# Patient Record
Sex: Male | Born: 1980 | Hispanic: Yes | Marital: Married | Smoking: Never smoker
Health system: Southern US, Community
[De-identification: ages and names within clinical notes are randomized; demographics above are authoritative.]

## PROBLEM LIST (undated history)

## (undated) HISTORY — PX: KNEE SURGERY: SHX244

---

## 2010-03-10 ENCOUNTER — Ambulatory Visit: Payer: Self-pay | Admitting: Diagnostic Radiology

## 2010-03-10 ENCOUNTER — Emergency Department (HOSPITAL_BASED_OUTPATIENT_CLINIC_OR_DEPARTMENT_OTHER): Admission: EM | Admit: 2010-03-10 | Discharge: 2010-03-10 | Payer: Self-pay | Admitting: Emergency Medicine

## 2010-03-30 ENCOUNTER — Encounter: Admission: RE | Admit: 2010-03-30 | Discharge: 2010-04-23 | Payer: Self-pay | Admitting: *Deleted

## 2011-09-03 ENCOUNTER — Ambulatory Visit: Payer: BC Managed Care – PPO

## 2011-12-29 ENCOUNTER — Ambulatory Visit: Payer: BC Managed Care – PPO

## 2012-01-17 ENCOUNTER — Telehealth: Payer: Self-pay

## 2012-01-17 MED ORDER — VALACYCLOVIR HCL 1 G PO TABS: 1000.0000 mg | ORAL_TABLET | Freq: Two times a day (BID) | ORAL | Status: AC

## 2012-01-17 NOTE — Telephone Encounter (Signed)
Please advise the patient that he needs an office visit in June.

## 2012-01-17 NOTE — Telephone Encounter (Signed)
Please pull paper chart.  

## 2012-01-17 NOTE — Telephone Encounter (Signed)
Pt seen for cold sores in the past,told to call if he wanted prescription for same,   Best phone [phone]   Pharmacy  [facility] [address]

## 2012-01-18 NOTE — Telephone Encounter (Signed)
NOTIFIED WIFE THAT PT NEEDS OFFICE VISIT IN June AND THAT RX WAS SENT IN

## 2013-09-12 ENCOUNTER — Ambulatory Visit (INDEPENDENT_AMBULATORY_CARE_PROVIDER_SITE_OTHER): Payer: BC Managed Care – PPO | Admitting: Family Medicine

## 2013-09-12 VITALS — BP 130/80 | HR 88 | Temp 99.0°F | Resp 16 | Ht 68.0 in | Wt 175.0 lb

## 2013-09-12 DIAGNOSIS — T7840XA Allergy, unspecified, initial encounter: Secondary | ICD-10-CM

## 2013-09-12 MED ORDER — PREDNISONE 20 MG PO TABS: 40.0000 mg | ORAL_TABLET | Freq: Every day | ORAL | Status: DC

## 2013-09-12 NOTE — Progress Notes (Signed)
° °  Subjective:  This chart was scribed for Elvina SidleKurt Lauenstein, MD by Carl Bestelina Holson, Medical Scribe. This patient was seen in Room 5 and the patient's care was started at 7:51 PM.   Patient ID: Jack Gordon, male    DOB: 10-30-1980, 33 y.o.   MRN: [id number]  HPI HPI Comments: Jack Houstonorfirio Gordon is a 33 y.o. male who presents to the [facility] complaining of irritation on his penis.  He states that he came to [facility] couple of years ago with similar symptoms and was told that he was diagnosed with a latex allergy.  He denies rash an as associated symptom.    History reviewed. No pertinent past medical history. Past Surgical History  Procedure Laterality Date   Knee surgery Right    History reviewed. No pertinent family history. History   Social History   Marital Status: Married    Spouse Name: N/A    Number of Children: N/A   Years of Education: N/A   Occupational History   Not on file.   Social History Main Topics   Smoking status: Never Smoker    Smokeless tobacco: Not on file   Alcohol Use: Not on file   Drug Use: Not on file   Sexual Activity: Not on file   Other Topics Concern   Not on file   Social History Narrative   No narrative on file   Allergies  Allergen Reactions   Latex Rash    Review of Systems  Skin: Negative for rash.  All other systems reviewed and are negative.     Objective:  Physical Exam  Nursing note and vitals reviewed. Constitutional: He is oriented to person, place, and time. He appears well-developed and well-nourished.  HENT:  Head: Normocephalic and atraumatic.  Eyes: EOM are normal. Pupils are equal, round, and reactive to light.  Neck: Normal range of motion and phonation normal.  Cardiovascular: Normal rate.   Pulmonary/Chest: Effort normal. He exhibits no bony tenderness.  Abdominal: Soft. Normal appearance.  Musculoskeletal: Normal range of motion.  Neurological: He is alert and oriented to  person, place, and time. No cranial nerve deficit or sensory deficit. He exhibits normal muscle tone. Coordination normal.  Skin: Skin is warm, dry and intact.  Psychiatric: He has a normal mood and affect. His behavior is normal. Judgment and thought content normal.   Penis shows some hyperemia at corona, uncircumcised with few flesh colored papuls  BP 130/80   Pulse 88   Temp(Src) 99 F (37.2 C) (Oral)   Resp 16   Ht 5\' 8"  (1.727 m)   Wt 175 lb (79.379 kg)   BMI 26.61 kg/m2   SpO2 100%  Assessment & Plan:   Meds ordered this encounter  Medications   Multiple Vitamins-Minerals (MULTIVITAL PO)    Sig: Take by mouth.   predniSONE (DELTASONE) 20 MG tablet    Sig: Take 2 tablets (40 mg total) by mouth daily.    Dispense:  10 tablet    Refill:  0   1. Allergic reaction, initial encounter     I personally performed the services described in this documentation, which was scribed in my presence. The recorded information has been reviewed and is accurate.  Kenyon AnaKurt Lauenstein,MD

## 2013-09-24 ENCOUNTER — Telehealth: Payer: Self-pay

## 2013-09-24 NOTE — Telephone Encounter (Signed)
Patient states the he was seen for an allergic reaction and was given Prednisone 20mg . States that it is not working for him. States that he was seen here 2-3 years ago for the same problem and he was given a cream which worked well.   [phone]

## 2013-09-25 NOTE — Telephone Encounter (Signed)
Please refill miconazole for me

## 2013-09-25 NOTE — Telephone Encounter (Signed)
Pulled paper chart and he was given miconazole powder apply to affected area bid x 4 wks 03/01/11.

## 2013-09-26 MED ORDER — MICONAZOLE NITRATE 2 % EX CREA: 1.0000 "application " | TOPICAL_CREAM | Freq: Two times a day (BID) | CUTANEOUS | Status: DC

## 2013-09-26 NOTE — Telephone Encounter (Signed)
Sent in Rx. Pt aware. 

## 2013-10-13 ENCOUNTER — Telehealth: Payer: Self-pay

## 2013-10-13 ENCOUNTER — Ambulatory Visit (INDEPENDENT_AMBULATORY_CARE_PROVIDER_SITE_OTHER): Payer: BC Managed Care – PPO | Admitting: Family Medicine

## 2013-10-13 VITALS — BP 120/74 | HR 86 | Temp 98.0°F | Resp 15 | Ht 67.0 in | Wt 175.0 lb

## 2013-10-13 DIAGNOSIS — L309 Dermatitis, unspecified: Secondary | ICD-10-CM

## 2013-10-13 DIAGNOSIS — L259 Unspecified contact dermatitis, unspecified cause: Secondary | ICD-10-CM

## 2013-10-13 DIAGNOSIS — N4829 Other inflammatory disorders of penis: Secondary | ICD-10-CM

## 2013-10-13 LAB — GLUCOSE, POCT (MANUAL RESULT ENTRY): POC Glucose: 100 mg/dl — AB (ref 70–99)

## 2013-10-13 MED ORDER — FLUCONAZOLE 100 MG PO TABS: 100.0000 mg | ORAL_TABLET | Freq: Every day | ORAL | Status: DC

## 2013-10-13 NOTE — Telephone Encounter (Signed)
Call patient:  I wanted to wait for 2 or 3 days to see what his culture does show before giving him Valtrex or other medication.

## 2013-10-13 NOTE — Progress Notes (Signed)
  He used to be cream that Dr. Elbert EwingsL. gave him and he thinks that it after using it for a cough began making his penis burn. He denies using condoms anymore. He started getting a feeling of the skin on the glans of the penis no history of herpes. He says he was tested for in the past.  Objective: He has a area of excoriation of the skin on the dorsum of the penis about 1 cm in diameter no other blistering lesions are noted.  Assessment: Penile inflammation and pain and dermatitis  Plan: HSV titers. Check a glucose to make sure he is not diabetic. He is arty been treated with antifungal so it should not be yeast.  Decided to go ahead and give him a course of Diflucan. Wait for the HSV culture before giving any Valtrex since the symptoms were a little atypical by duration.   Results for orders placed in visit on 10/13/13  GLUCOSE, POCT (MANUAL RESULT ENTRY)      Result Value Range   POC Glucose 100 (*) 70 - 99 mg/dl

## 2013-10-13 NOTE — Patient Instructions (Addendum)
Take the Diflucan one pill daily for 5 days  Purchased some over-the-counter 1% hydrocortisone cream and use a small amount of it twice daily around the glans of the penis  I will try and let you know the results of your blood look in the next 2 or 3 days. If you do not hear from it by Tuesday call back

## 2013-10-13 NOTE — Telephone Encounter (Signed)
Pt would like a rx for valtrex, he states that he discussed getting this rx with Dr.Hopper during his OV today but it was not sent in, pt states that he has taken valtrex in the past.  [facility]

## 2013-10-14 NOTE — Telephone Encounter (Signed)
Left message to return call 

## 2013-10-14 NOTE — Telephone Encounter (Signed)
Patient notified and voiced understanding.

## 2013-10-15 LAB — HSV(HERPES SIMPLEX VRS) I + II AB-IGG
HSV 1 Glycoprotein G Ab, IgG: 12.57 IV — ABNORMAL HIGH
HSV 2 Glycoprotein G Ab, IgG: 0.88 IV

## 2013-10-19 ENCOUNTER — Telehealth: Payer: Self-pay | Admitting: *Deleted

## 2013-10-19 NOTE — Telephone Encounter (Signed)
Pt called about lab results.  Can you review them?  Pt can be called at [phone]

## 2013-10-20 NOTE — Telephone Encounter (Signed)
Pt aware of results He is planning to [facility]

## 2013-10-20 NOTE — Telephone Encounter (Signed)
Call Patient:  Labs show  no evidence of other herpes infection on his penis, however he has had the fever blister virus before but that did not cause this place on the penis.  If he continues to have symptoms he needs tor return for recheck.

## 2013-10-22 ENCOUNTER — Ambulatory Visit (INDEPENDENT_AMBULATORY_CARE_PROVIDER_SITE_OTHER): Payer: BC Managed Care – PPO | Admitting: Emergency Medicine

## 2013-10-22 VITALS — BP 138/92 | HR 108 | Temp 98.3°F | Resp 18 | Ht 66.5 in | Wt 179.2 lb

## 2013-10-22 DIAGNOSIS — N476 Balanoposthitis: Secondary | ICD-10-CM

## 2013-10-22 DIAGNOSIS — N481 Balanitis: Secondary | ICD-10-CM

## 2013-10-22 MED ORDER — NYSTATIN-TRIAMCINOLONE 100000-0.1 UNIT/GM-% EX OINT: 1.0000 "application " | TOPICAL_OINTMENT | Freq: Two times a day (BID) | CUTANEOUS | Status: DC

## 2013-10-22 NOTE — Progress Notes (Signed)
[facility] [address], [place] [address] [phone]  Date:  10/22/2013   Name:  Jack Gordon   DOB:  May 22, 1981   MRN:  [id number]  PCP:  No primary provider on file.    Chief Complaint: Allergic Reaction   History of Present Illness:  Jack Gordon is a 33 y.o. very pleasant male patient who presents with the following:  Seen a week ago for an eruption on the penis.  No improvement with medication.  Has returned.  No discharge or dysuria.  Herpes negative.  There are no active problems to display for this patient.   History reviewed. No pertinent past medical history.  Past Surgical History  Procedure Laterality Date  . Knee surgery Right     History  Substance Use Topics  . Smoking status: Never Smoker   . Smokeless tobacco: Not on file  . Alcohol Use: Not on file    History reviewed. No pertinent family history.  Allergies  Allergen Reactions  . Latex Rash    Medication list has been reviewed and updated.  Current Outpatient Prescriptions on File Prior to Visit  Medication Sig Dispense Refill  . fluconazole (DIFLUCAN) 100 MG tablet Take 1 tablet (100 mg total) by mouth daily.  5 tablet  0  . miconazole (MICOTIN) 2 % cream Apply 1 application topically 2 (two) times daily. Apply BID for 4 weeks  28.35 g  2  . Multiple Vitamins-Minerals (MULTIVITAL PO) Take by mouth.      . predniSONE (DELTASONE) 20 MG tablet Take 2 tablets (40 mg total) by mouth daily.  10 tablet  0  . VITAMIN C, CALCIUM ASCORBATE, PO Take by mouth.       No current facility-administered medications on file prior to visit.    Review of Systems:  As per HPI, otherwise negative.    Physical Examination: Filed Vitals:   10/22/13 1554  BP: 138/92  Pulse: 108  Temp: 98.3 F (36.8 C)  Resp: 18   Filed Vitals:   10/22/13 1554  Height: 5' 6.5" (1.689 m)  Weight: 179 lb 3.2 oz (81.285 kg)   Body mass index is 28.49 kg/(m^2). Ideal Body Weight:  Weight in (lb) to have BMI = 25: 156.9   GEN: WDWN, NAD, Non-toxic, Alert & Oriented x 3 HEENT: Atraumatic, Normocephalic.  Ears and Nose: No external deformity. EXTR: No clubbing/cyanosis/edema NEURO: Normal gait.  PSYCH: Normally interactive. Conversant. Not depressed or anxious appearing.  Calm demeanor.  GENITALIA  Uncircumcised male.  Very small papules on the glans.  Suggestive of fungal outbreak.  Assessment and Plan: Candidal balanitis mycolog Urology consult wants to discuss circumcision   Signed,  Phillips OdorJeffery Zackary Mckeone, MD

## 2013-10-22 NOTE — Patient Instructions (Signed)
Balanitis °(Balanitis) °La balanitis es la inflamación de la cabeza del pene (glande).  °CAUSAS  °Puede tener múltiples causas, tanto infecciosas como no infecciosas. Cuando se produce con frecuencia, es el resultado de una higiene personal deficiente, especialmente en los hombres no circuncidados. Sin una higiene adecuada, los virus, las bacterias y los hongos se acumulan entre el prepucio y el glande. Esto puede ocasionarle una infección. La falta de aire y la irritación debido a la secreción normal llamada esmegma contribuyen al problema en los hombres no circuncidados. Otras causas son: °· Irritación química por el uso de ciertos jabones y geles de ducha (especialmente jabones con perfume) condones, lubricantes personales, vaselina, espermicidas y acondicionadores de telas. °· Enfermedades de la piel, como el eczema, la dermatitis y la psoriasis. °· Alergias a algunos fármacos, como tetraciclina y sulfas. °· Ciertas enfermedades, como la cirrosis hepática, insuficiencia cardíaca congestiva y enfermedades renales. °· Obesidad mórbida. °FACTORES DE RIESGO °· Diabetes mellitus. °· Fimosis Un prepucio apretado que dificulta retirarlo hacia atrás hasta pasar el glande. °· Tener relaciones sexuales sin usar un condón. °SIGNOS Y SÍNTOMAS  °Los síntomas pueden ser: °· Secreción que proviene de la zona debajo del prepucio. °· Sensibilidad. °· Picazón e imposibilidad para mantener una erección (debido al dolor). °· Irritación y erupción. °· Llagas en el glande y en el prepucio. °DIAGNÓSTICO °El diagnóstico de balanitis se realiza a través de un examen físico. °TRATAMIENTO °El tratamiento se basa en la causa. El tratamiento incluye lavados frecuentes, mantener el glande y el prepucio secos, el uso de medicamentos como cremas, analgésicos, antibióticos o medicamentos para tratar infecciones por hongos. Puede usar baños de asiento. Si la irritación está originada en una cicatriz del prepucio que impide una retracción fácil,  se recomienda una circuncisión.  °INSTRUCCIONES PARA EL CUIDADO EN EL HOGAR °· Debe evitar las relaciones sexuales hasta que la afección se haya mejorado. °Document Released: 04/25/2013 °ExitCare® Patient Information ©2014 [employer]. ° ° °

## 2013-11-08 ENCOUNTER — Ambulatory Visit (INDEPENDENT_AMBULATORY_CARE_PROVIDER_SITE_OTHER): Payer: BC Managed Care – PPO | Admitting: Family Medicine

## 2013-11-08 VITALS — BP 146/86 | HR 91 | Temp 98.9°F | Resp 18 | Ht 67.0 in | Wt 175.0 lb

## 2013-11-08 DIAGNOSIS — N4889 Other specified disorders of penis: Secondary | ICD-10-CM

## 2013-11-08 NOTE — Progress Notes (Signed)
Subjective:   This chart was scribed for Jack Staggers, MD by Arlan Organ, [facility] Scribe. This patient was seen in room 12 and the patient's care was started 4:09 PM.     Patient ID: Jack Gordon, male    DOB: 1980/09/15, 33 y.o.   MRN: [id number]  HPI  HPI Comments: Jack Gordon is a 33 y.o. male who presents to [facility] seeking follow up today from initial visit on 09/12/13. Pt was treated with prednisone 40 mg for 5 days for an allergic reaction. Per telephone note,  Miconazole cream prescribed on 09/26/13 as apparent similar symptoms in the past, and was treated with this medication. Repeat evaluation 10/13/13. Excoriated area on dorsum of penis without blistering. Glucose normal. 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 Treated with number 5 Dyphloecane 100 mg daily. Recheck office visit of 2/16. Suspected candidal balanitis. Prescribed Mycolog and referred to urology to discuss circumcision. Similar penile symptoms in past including 2012 with a bump under the foreskin of the penis. Did have HSVIGG test performed. Positive for Type 1 only. Treated with Valtrex 100 mg BID times 7 days.  Pt now suspects a latex allergy from condoms he has recently been using. He has not noted any improvement since last visit, and states creams prescribed have been ineffective for him. At this time he denies any fever or penile discharge. Denies any new sexual contact.  Pt states he has cancelled his appointment with Urology due to his work schedule. He plans to call back as soon as possible to reschedule.  There are no active problems to display for this patient.  History reviewed. No pertinent past medical history. Past Surgical History  Procedure Laterality Date  . Knee surgery Right    Allergies  Allergen Reactions  . Latex Rash   Prior to Admission medications   Medication Sig Start Date End Date Taking?  Authorizing Provider  Multiple Vitamins-Minerals (MULTIVITAL PO) Take by mouth.   Yes Historical Provider, MD  VITAMIN C, CALCIUM ASCORBATE, PO Take by mouth.   Yes Historical Provider, MD  fluconazole (DIFLUCAN) 100 MG tablet Take 1 tablet (100 mg total) by mouth daily. 10/13/13   Peyton Najjar, MD  miconazole (MICOTIN) 2 % cream Apply 1 application topically 2 (two) times daily. Apply BID for 4 weeks 09/26/13   Elvina Sidle, MD  nystatin-triamcinolone ointment [facility]) Apply 1 application topically 2 (two) times daily. 10/22/13   Phillips Odor, MD  predniSONE (DELTASONE) 20 MG tablet Take 2 tablets (40 mg total) by mouth daily. 09/12/13   Elvina Sidle, MD   History   Social History  . Marital Status: Married    Spouse Name: N/A    Number of Children: N/A  . Years of Education: N/A   Occupational History  . Not on file.   Social History Main Topics  . Smoking status: Never Smoker   . Smokeless tobacco: Not on file  . Alcohol Use: Not on file  . Drug Use: Not on file  . Sexual Activity: Not on file   Other Topics Concern  . Not on file   Social History Narrative  . No narrative on file     Review of Systems  Constitutional: Negative for fever and chills.  HENT: Negative for congestion.   Eyes: Negative for redness.  Respiratory: Negative for cough.   Gastrointestinal: Negative for abdominal pain.  Genitourinary: Negative for dysuria,  discharge, penile swelling, scrotal swelling, difficulty urinating and penile pain.  Skin: Positive for rash.  Neurological: Negative for headaches.  Psychiatric/Behavioral: Negative for confusion.    Filed Vitals:   11/08/13 1520  BP: 146/86  Pulse: 91  Temp: 98.9 F (37.2 C)  TempSrc: Oral  Resp: 18  Height: 5\' 7"  (1.702 m)  Weight: 175 lb (79.379 kg)  SpO2: 97%    Objective:  Physical Exam  Nursing note and vitals reviewed. Constitutional: He is oriented to person, place, and time. He appears well-developed and  well-nourished.  HENT:  Head: Normocephalic and atraumatic.  Eyes: EOM are normal.  Neck: Normal range of motion.  Cardiovascular: Normal rate.   Pulmonary/Chest: Effort normal.  Genitourinary:  Minimal erythema of the corona No discharge No ulcers Small fine pearly papules around corona   Musculoskeletal: Normal range of motion.  Neurological: He is alert and oriented to person, place, and time.  Skin: Skin is warm and dry.  Psychiatric: He has a normal mood and affect. His behavior is normal.        Assessment & Plan:   Jack Gordon is a 33 y.o. male Pearly penile papules Discussed likely normal variant and no specific treatment for these. However if erythema of corona or surrounding skin, can use topical azole until urology eval. rtc precautions.   No orders of the defined types were placed in this encounter.   Patient Instructions  The small bumps appear to be "benign pearly papules of the penis" and no treatment is needed. Okay to continue [employer]ove Soap, but if redness in area, ok to apply antifungal cream for 1-2 weeks.  Follow up with urologist to discuss this area of redness. Return to the clinic or go to the nearest emergency room if any of your symptoms worsen or new symptoms occur.   I personally performed the services described in this documentation, which was scribed in my presence. The recorded information has been reviewed and is accurate.

## 2013-11-08 NOTE — Patient Instructions (Signed)
The small bumps appear to be "benign pearly papules of the penis" and no treatment is needed. Okay to continue [employer]ove Soap, but if redness in area, ok to apply antifungal cream for 1-2 weeks.  Follow up with urologist to discuss this area of redness. Return to the clinic or go to the nearest emergency room if any of your symptoms worsen or new symptoms occur.

## 2014-03-18 ENCOUNTER — Ambulatory Visit (INDEPENDENT_AMBULATORY_CARE_PROVIDER_SITE_OTHER): Payer: BC Managed Care – PPO | Admitting: Emergency Medicine

## 2014-03-18 VITALS — BP 126/78 | HR 73 | Temp 97.7°F | Resp 16 | Ht 66.5 in | Wt 178.8 lb

## 2014-03-18 DIAGNOSIS — M7710 Lateral epicondylitis, unspecified elbow: Secondary | ICD-10-CM

## 2014-03-18 DIAGNOSIS — M7712 Lateral epicondylitis, left elbow: Secondary | ICD-10-CM

## 2014-03-18 MED ORDER — TENNIS ELBOW STRAP MISC: Status: AC

## 2014-03-18 MED ORDER — NAPROXEN SODIUM 550 MG PO TABS: 550.0000 mg | ORAL_TABLET | Freq: Two times a day (BID) | ORAL | Status: AC

## 2014-03-18 NOTE — Patient Instructions (Signed)
Tennis Elbow Your caregiver has diagnosed you with a condition often referred to as "tennis elbow." This results from small tears or soreness (inflammation) at the start (origin) of the extensor muscles of the forearm. Although the condition is often called tennis or golfer's elbow, it is caused by any repetitive action performed by your elbow. HOME CARE INSTRUCTIONS  If the condition has been short lived, rest may be the only treatment required. Using your opposite hand or arm to perform the task may help. Even changing your grip may help rest the extremity. These may even prevent the condition from recurring.  Longer standing problems, however, will often be relieved faster by:  Using anti-inflammatory agents.  Applying ice packs for 30 minutes at the end of the working day, at bed time, or when activities are finished.  Your caregiver may also have you wear a splint or sling. This will allow the inflamed tendon to heal. At times, steroid injections aided with a local anesthetic will be required along with splinting for 1 to 2 weeks. Two to three steroid injections will often solve the problem. In some long standing cases, the inflamed tendon does not respond to conservative (non-surgical) therapy. Then surgery may be required to repair it. MAKE SURE YOU:   Understand these instructions.  Will watch your condition.  Will get help right away if you are not doing well or get worse. Document Released: 08/23/2005 Document Revised: 11/15/2011 Document Reviewed: 04/10/2008 ExitCare Patient Information 2015 [employer]. This information is not intended to replace advice given to you by your health care provider. Make sure you discuss any questions you have with your health care provider.  

## 2014-03-18 NOTE — Progress Notes (Signed)
[facility] [address], [place] [address] [phone]  Date:  03/18/2014   Name:  Jack Gordon   DOB:  08-27-1981   MRN:  [id number]  PCP:  No PCP Per Patient    Chief Complaint: Immunizations and Elbow Injury   History of Present Illness:  Jack Gordon is a 33 y.o. very pleasant male patient who presents with the following:  Patient was working out in the gym and injured his left elbow.  Has pain in lateral epicondyle.  No swelling or discoloration.  No improvement with over the counter medications or other home remedies. Denies other complaint or health concern today.   There are no active problems to display for this patient.   History reviewed. No pertinent past medical history.  Past Surgical History  Procedure Laterality Date  . Knee surgery Right     History  Substance Use Topics  . Smoking status: Former Games developermoker  . Smokeless tobacco: Never Used  . Alcohol Use: Yes     Comment: occasional    History reviewed. No pertinent family history.  Allergies  Allergen Reactions  . Latex Rash    Medication list has been reviewed and updated.  Current Outpatient Prescriptions on File Prior to Visit  Medication Sig Dispense Refill  . Multiple Vitamins-Minerals (MULTIVITAL PO) Take by mouth.      . fluconazole (DIFLUCAN) 100 MG tablet Take 1 tablet (100 mg total) by mouth daily.  5 tablet  0  . miconazole (MICOTIN) 2 % cream Apply 1 application topically 2 (two) times daily. Apply BID for 4 weeks  28.35 g  2  . nystatin-triamcinolone ointment (MYCOLOG) Apply 1 application topically 2 (two) times daily.  30 g  0  . predniSONE (DELTASONE) 20 MG tablet Take 2 tablets (40 mg total) by mouth daily.  10 tablet  0  . VITAMIN C, CALCIUM ASCORBATE, PO Take by mouth.       No current facility-administered medications on file prior to visit.    Review of Systems:  As per HPI, otherwise negative.    Physical Examination: Filed Vitals:    03/18/14 1953  BP: 126/78  Pulse: 73  Temp: 97.7 F (36.5 C)  Resp: 16   Filed Vitals:   03/18/14 1953  Height: 5' 6.5" (1.689 m)  Weight: 178 lb 12.8 oz (81.103 kg)   Body mass index is 28.43 kg/(m^2). Ideal Body Weight: Weight in (lb) to have BMI = 25: 156.9   GEN: WDWN, NAD, Non-toxic, Alert & Oriented x 3 HEENT: Atraumatic, Normocephalic.  Ears and Nose: No external deformity. EXTR: No clubbing/cyanosis/edema NEURO: Normal gait.  PSYCH: Normally interactive. Conversant. Not depressed or anxious appearing.  Calm demeanor.  Left elbow:  Tender lateral epicondyle.  Full AROM.  Assessment and Plan: Tennis elbow Anaprox Tennis elbow support  Signed,  Phillips OdorJeffery Renuka Farfan, MD

## 2014-03-25 ENCOUNTER — Ambulatory Visit (INDEPENDENT_AMBULATORY_CARE_PROVIDER_SITE_OTHER): Payer: BC Managed Care – PPO | Admitting: Family Medicine

## 2014-03-25 VITALS — BP 130/88 | HR 56 | Temp 98.1°F | Resp 20 | Ht 66.5 in | Wt 177.2 lb

## 2014-03-25 DIAGNOSIS — Z7189 Other specified counseling: Secondary | ICD-10-CM

## 2014-03-25 DIAGNOSIS — Z7185 Encounter for immunization safety counseling: Secondary | ICD-10-CM

## 2014-03-25 MED ORDER — TETANUS-DIPHTH-ACELL PERTUSSIS 5-2.5-18.5 LF-MCG/0.5 IM SUSP
0.5000 mL | Freq: Once | INTRAMUSCULAR | Status: AC
Start: 2014-03-25 — End: ?

## 2014-03-25 NOTE — Patient Instructions (Signed)
33 year old [place] gentleman who is here for immunization review.  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  MMR titres are pending Tdap given today

## 2014-03-26 NOTE — Progress Notes (Signed)
This is a 33 year old patient has applied for immigration status. He needs to have his immunizations updated for completeness immigration

## 2014-03-27 ENCOUNTER — Ambulatory Visit (INDEPENDENT_AMBULATORY_CARE_PROVIDER_SITE_OTHER): Payer: BC Managed Care – PPO

## 2014-03-27 VITALS — BP 122/82 | HR 76 | Temp 97.9°F | Resp 16

## 2014-03-27 DIAGNOSIS — Z23 Encounter for immunization: Secondary | ICD-10-CM

## 2014-03-27 LAB — MEASLES/MUMPS/RUBELLA IMMUNITY
Mumps IgG: 259 AU/mL — ABNORMAL HIGH (ref <9.00)
Rubella: 29.7 Index — ABNORMAL HIGH (ref <0.90)
Rubeola IgG: 12.9 AU/mL (ref <25.00)

## 2016-01-14 ENCOUNTER — Ambulatory Visit (INDEPENDENT_AMBULATORY_CARE_PROVIDER_SITE_OTHER): Payer: 59

## 2016-01-14 ENCOUNTER — Encounter (HOSPITAL_COMMUNITY): Payer: Self-pay | Admitting: *Deleted

## 2016-01-14 ENCOUNTER — Ambulatory Visit (HOSPITAL_COMMUNITY)
Admission: EM | Admit: 2016-01-14 | Discharge: 2016-01-14 | Disposition: A | Discharge status: Home / Self Care | Payer: 59 | Attending: Family Medicine | Admitting: Family Medicine

## 2016-01-14 DIAGNOSIS — S6000XA Contusion of unspecified finger without damage to nail, initial encounter: Secondary | ICD-10-CM

## 2016-01-14 DIAGNOSIS — Z23 Encounter for immunization: Secondary | ICD-10-CM

## 2016-01-14 MED ORDER — TETANUS-DIPHTH-ACELL PERTUSSIS 5-2.5-18.5 LF-MCG/0.5 IM SUSP
0.5000 mL | Freq: Once | INTRAMUSCULAR | Status: AC
  Administered 2016-01-14: 0.5 mL via INTRAMUSCULAR

## 2016-01-14 MED ORDER — TETANUS-DIPHTH-ACELL PERTUSSIS 5-2.5-18.5 LF-MCG/0.5 IM SUSP
INTRAMUSCULAR | Status: AC
  Filled 2016-01-14: qty 0.5
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
## 2016-01-14 NOTE — Discharge Instructions (Signed)
Soak hand , wear splint for comfort, advil as needed, see orthopedist if further problems, ok to work.
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
## 2017-01-13 ENCOUNTER — Ambulatory Visit (INDEPENDENT_AMBULATORY_CARE_PROVIDER_SITE_OTHER): Payer: Worker's Compensation | Admitting: Physician Assistant

## 2017-01-13 ENCOUNTER — Encounter: Payer: Self-pay | Admitting: Physician Assistant

## 2017-01-13 ENCOUNTER — Ambulatory Visit: Payer: Self-pay | Admitting: Physician Assistant

## 2017-01-13 VITALS — BP 137/85 | HR 61 | Temp 98.7°F | Resp 18 | Wt 185.4 lb

## 2017-01-13 DIAGNOSIS — Z026 Encounter for examination for insurance purposes: Secondary | ICD-10-CM

## 2017-01-13 DIAGNOSIS — M542 Cervicalgia: Secondary | ICD-10-CM | POA: Diagnosis not present

## 2017-01-13 MED ORDER — MELOXICAM 15 MG PO TABS: 15.0000 mg | ORAL_TABLET | Freq: Every day | ORAL | 0 refills | Status: AC

## 2017-01-13 MED ORDER — CYCLOBENZAPRINE HCL 10 MG PO TABS: 5.0000 mg | ORAL_TABLET | Freq: Three times a day (TID) | ORAL | 0 refills | Status: AC | PRN
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
## 2017-01-13 NOTE — Patient Instructions (Signed)
     IF you received an x-ray today, you will receive an invoice from [facility]. Please contact [facility] at [phone] with questions or concerns regarding your invoice.   IF you received labwork today, you will receive an invoice from [facility]. Please contact [facility] at [phone] with questions or concerns regarding your invoice.   Our billing staff will not be able to assist you with questions regarding bills from these companies.  You will be contacted with the lab results as soon as they are available. The fastest way to get your results is to activate your My Chart account. Instructions are located on the last page of this paperwork. If you have not heard from us regarding the results in 2 weeks, please contact this office.     
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
## 2017-10-25 ENCOUNTER — Encounter (INDEPENDENT_AMBULATORY_CARE_PROVIDER_SITE_OTHER): Payer: Self-pay | Admitting: Orthopedic Surgery

## 2017-10-25 ENCOUNTER — Ambulatory Visit (INDEPENDENT_AMBULATORY_CARE_PROVIDER_SITE_OTHER): Payer: 59 | Admitting: Orthopedic Surgery

## 2017-10-25 ENCOUNTER — Ambulatory Visit (INDEPENDENT_AMBULATORY_CARE_PROVIDER_SITE_OTHER): Payer: 59

## 2017-10-25 VITALS — Ht 66.0 in | Wt 185.0 lb

## 2017-10-25 DIAGNOSIS — M25511 Pain in right shoulder: Secondary | ICD-10-CM | POA: Diagnosis not present

## 2017-10-25 DIAGNOSIS — M25521 Pain in right elbow: Secondary | ICD-10-CM | POA: Diagnosis not present

## 2017-10-25 NOTE — Progress Notes (Signed)
Office Visit Note   Patient: Jack Gordon           Date of Birth: 20-Apr-1981           MRN: [id number] Visit Date: 10/25/2017              Requested by: Dois Davenportichter, Karen L, MD [address] [place], [place] [id number] PCP: Patient, No Pcp Per  Chief Complaint  Patient presents with  . Right Elbow - Pain      HPI: Patient is a 37 year old gentleman who states is been having several years of pain of pain in the right shoulder in the trapezius muscles and pain in the right elbow laterally.  He states he cannot lift his shoulder overhead.  He denies any neck pain denies any radicular symptoms denies any weakness.  Assessment & Plan: Visit Diagnoses:  1. Pain in right elbow   2. Acute pain of right shoulder     Plan: Patient will use ibuprofen 600 mg 3 times a day for 2 weeks to see if this would help.  There is no sign of cervical radiculopathy no tendinitis no focal findings.  Follow-Up Instructions: Return if symptoms worsen or fail to improve.   Ortho Exam  Patient is alert, oriented, no adenopathy, well-dressed, normal affect, normal respiratory effort. Examination patient has a normal gait.  He has full range of motion of the right shoulder Neer and Hawkins impingement test are negative the [facility] is nontender to palpation the biceps tendon is nontender to palpation.  He has some tenderness to palpation over the trapezius muscle the thoracic outlet is nontender to palpation the ENT has no pain with range of motion of his neck.  Semination is full range of motion of the right elbow.  Supination pronation flexion and extension lateral epicondyle medial epicondyle and olecranon are nontender to palpation.  Ulnar groove is nontender to palpation resisted extension of the wrist is normal.  Imaging: Xr Elbow 2 Views Right  Result Date: 10/25/2017 2 view radiographs of the right elbow shows a negative fat pad sign no evidence of a fracture no joint space  narrowing the joints are congruent.  Xr Shoulder Right  Result Date: 10/25/2017 2 view radiographs of the right shoulder shows a congruent glenohumeral joint.  There is no AC arthropathy.  No images are attached to the encounter.  Labs: No results found for: HGBA1C, ESRSEDRATE, CRP, LABURIC, REPTSTATUS, GRAMSTAIN, CULT, LABORGA  @LABSALLVALUES (HGBA1)@  Body mass index is 29.86 kg/m.  Orders:  Orders Placed This Encounter  Procedures  . XR Shoulder Right  . XR Elbow 2 Views Right   No orders of the defined types were placed in this encounter.    Procedures: No procedures performed  Clinical Data: No additional findings.  ROS:  All other systems negative, except as noted in the HPI. Review of Systems  Objective: Vital Signs: Ht 5\' 6"  (1.676 m)   Wt 185 lb (83.9 kg)   BMI 29.86 kg/m   Specialty Comments:  No specialty comments available.  PMFS History: There are no active problems to display for this patient.  History reviewed. No pertinent past medical history.  History reviewed. No pertinent family history.  Past Surgical History:  Procedure Laterality Date  . KNEE SURGERY Right   . KNEE SURGERY     Social History   Occupational History  . Not on file  Tobacco Use  . Smoking status: Never Smoker  . Smokeless tobacco: Never  Used  Substance and Sexual Activity  . Alcohol use: Yes    Comment: occasional  . Drug use: No  . Sexual activity: Not on file

## 2017-12-14 ENCOUNTER — Encounter: Payer: Self-pay | Admitting: Physician Assistant
